# Patient Record
Sex: Female | Born: 2005 | Race: Black or African American | Hispanic: No | Marital: Single | Smoking: Never smoker
Health system: Southern US, Community
[De-identification: ages and names within clinical notes are randomized; demographics above are authoritative.]

## PROBLEM LIST (undated history)

## (undated) ENCOUNTER — Emergency Department (HOSPITAL_COMMUNITY): Admission: EM | Payer: Medicaid Other

---
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
## 2024-04-29 ENCOUNTER — Encounter: Payer: Self-pay | Admitting: Physician Assistant

## 2024-04-29 ENCOUNTER — Ambulatory Visit: Payer: Self-pay | Admitting: Physician Assistant

## 2024-04-29 ENCOUNTER — Other Ambulatory Visit (HOSPITAL_COMMUNITY)
Admission: RE | Admit: 2024-04-29 | Discharge: 2024-04-29 | Disposition: A | Discharge status: Home / Self Care | Source: Ambulatory Visit | Attending: Physician Assistant | Admitting: Physician Assistant

## 2024-04-29 VITALS — BP 99/61 | HR 76 | Ht 62.0 in | Wt 148.2 lb

## 2024-04-29 DIAGNOSIS — B3731 Acute candidiasis of vulva and vagina: Secondary | ICD-10-CM | POA: Diagnosis not present

## 2024-04-29 DIAGNOSIS — B379 Candidiasis, unspecified: Secondary | ICD-10-CM

## 2024-04-29 NOTE — Progress Notes (Signed)
 GYNECOLOGY  VISIT   HPI: Diane Mora is a 18 y.o.  Single female here for recurrent yeast infection    Patient reports three yeast infections since July 2024, with most recent infection earlier this month. She is asymptomatic today, presents for test of cure to ensure last infection is resolved, and to learn about prevention.   GYNECOLOGIC HISTORY: No LMP recorded. Contraception: none Menopausal hormone therapy:  premenopausal Last mammogram:  Not yet indicated due to age Last pap smear: No results found for: "DIAGPAP"         OB History   No obstetric history on file.        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  No past surgical history on file.  Current Outpatient Medications  Medication Sig Dispense Refill   doxycycline (VIBRAMYCIN) 100 MG capsule Take 100 mg by mouth 2 (two) times daily.     No current facility-administered medications for this visit.     ALLERGIES: Patient has no known allergies.  No family history on file.  Social History   Socioeconomic History   Marital status: Single    Spouse name: Not on file   Number of children: Not on file   Years of education: Not on file   Highest education level: Not on file  Occupational History   Not on file  Tobacco Use   Smoking status: Never    Passive exposure: Yes   Smokeless tobacco: Never  Substance and Sexual Activity   Alcohol use: Not on file   Drug use: Not on file   Sexual activity: Not on file  Other Topics Concern   Not on file  Social History Narrative   Diane Mora is a rising 11th grade student.   She attends Education officer, environmental at [employer]   She lives with both parents.   She has two siblings.   Social Drivers of Corporate investment banker Strain: Not on file  Food Insecurity: Not on file  Transportation Needs: Not on file  Physical Activity: Not on file   Stress: Not on file  Social Connections: Not on file  Intimate Partner Violence: Not on file    Review of Systems  PHYSICAL EXAMINATION:    There were no vitals taken for this visit.    General appearance: alert, cooperative and appears stated age Head: Normocephalic, without obvious abnormality, atraumatic Neck: no adenopathy, supple, symmetrical, trachea midline  Lungs: clear to auscultation bilaterally Heart: regular rate  Skin: Skin color, texture, turgor normal.  Neurologic: Grossly normal  ASSESSMENT & PLAN   1. Candida vaginitis (Primary) Patient presents reporting 3 candida infections over past year. Asymptomatic today, desires TOC for last infection earlier this month. Unclear etiology given no ABX therapy during this time or pertinent medical history. We discussed sustained maintenance therapy if swab is positive today, to which she agrees. We discussed causes of candida and lifestyle habits to prevent infection, also available in AVS.  - Cervicovaginal ancillary only( [place])   An After Visit Summary was printed and given to the patient.  Klee Kolek E Candice Tobey, PA-C 5/16/20258:00 AM

## 2024-04-29 NOTE — Progress Notes (Signed)
 Pt reports 3 yeast infections over the last year. Symptoms alleviated with Diflucan. Denies symptoms today. Requesting swab today

## 2024-04-29 NOTE — Patient Instructions (Addendum)
 What is a vulvovaginal yeast infection?  This is an infection that causes itching and irritation of the vulva, the outer lips of the vagina (figure 1). The infection is usually caused by a fungus called "Candida." (Yeast are a type of fungus.)  What causes yeast infections?  The fungus that causes yeast infections normally lives in the vagina and the gut. Even though the yeast are there, they do not usually cause symptoms. Certain medicines (especially antibiotics), stress, and other things can cause the fungus to grow more than it should. When that happens, a yeast infection can start.  Your risk of getting a yeast infection is higher if you:  ?Have diabetes  ?Are pregnant  ?Have a weaker-than-normal immune system  Yeast infections are not usually spread through sex.  What are the symptoms of a yeast infection?  Symptoms include:  ?Itching of the vulva (this is the most common symptom)  ?Pain, redness, or irritation of the vulva and vagina  ?Pain when you urinate  ?Pain during sex  ?Abnormal vaginal discharge, which might be thick and white or thin and watery  The symptoms of a yeast infection are a lot like the symptoms of many other conditions. The best way to find out what is causing your symptoms is to see your doctor or nurse. This way, you can get the right treatment.  Is there a test for yeast infection?  Yes. Your doctor or nurse will use a swab to get a sample of fluid from your vagina. Then, they will put it under a microscope and look for the fungus that causes yeast infections. Sometimes, they might do a test to find out which type of yeast you have.  Depending on your situation, your doctor or nurse might do other tests on your vaginal fluid, too. One common test checks for yeast infections as well as bacterial vaginosis and trichomoniasis. These are other infections that can also cause itching and irritation.  How are yeast infections treated?  Yeast  infections are treated with medicines. All medicines for yeast infections work by killing the fungus that causes the infections. They come in different forms:  ?A pill you take by mouth  ?Medicines you put in your vagina and on your vulva - These come as creams or tablets. This is the preferred treatment for pregnant people.  When will I feel better?  You will probably feel better within a few days of starting treatment. If you do not get better after you finish treatment, see your doctor or nurse again. You might need to take more medicine or a different medicine.  What if I get yeast infections often?  Tell your doctor or nurse. They can help find out whether your symptoms are caused by a yeast infection and, if so, which type of yeast. There are a few different types of yeast, and they respond to different treatments. Plus, the same symptoms that you get with a yeast infection can sometimes be caused by other types of infections, an allergy, or other problems. If you get frequent infections, you might need a different treatment than what you tried in the past.  When should I call the doctor?  Call your doctor or nurse for advice if your symptoms do not get better after treatment. It might take up to a week for symptoms to improve.  To prevent yeast infections:   -Keep the genital area cool and dry by wearing cotton underwear, avoiding tight-fitting clothes, and changing out of damp  clothes or wet bathing suits promptly.  -Avoid triggers. Identifying potential triggers such as antibiotic use, hormonal changes, and high blood sugar can help reduce the risk of recurrence.

## 2024-05-02 ENCOUNTER — Ambulatory Visit: Payer: Self-pay | Admitting: Physician Assistant

## 2024-05-02 ENCOUNTER — Other Ambulatory Visit: Payer: Self-pay

## 2024-05-02 DIAGNOSIS — B9689 Other specified bacterial agents as the cause of diseases classified elsewhere: Secondary | ICD-10-CM

## 2024-05-02 LAB — CERVICOVAGINAL ANCILLARY ONLY
Bacterial Vaginitis (gardnerella): POSITIVE — AB
Candida Glabrata: NEGATIVE
Candida Vaginitis: NEGATIVE
Comment: NEGATIVE
Comment: NEGATIVE
Comment: NEGATIVE

## 2024-05-03 MED ORDER — METRONIDAZOLE 500 MG PO TABS: 500.0000 mg | ORAL_TABLET | Freq: Two times a day (BID) | ORAL | 0 refills | Status: AC

## 2024-05-19 ENCOUNTER — Ambulatory Visit: Payer: Self-pay | Admitting: Physician Assistant

## 2024-05-26 ENCOUNTER — Ambulatory Visit: Payer: Self-pay | Admitting: Physician Assistant

## 2024-05-26 ENCOUNTER — Encounter: Payer: Self-pay | Admitting: Physician Assistant

## 2024-05-26 ENCOUNTER — Other Ambulatory Visit (HOSPITAL_COMMUNITY)
Admission: RE | Admit: 2024-05-26 | Discharge: 2024-05-26 | Disposition: A | Discharge status: Home / Self Care | Source: Ambulatory Visit | Attending: Physician Assistant | Admitting: Physician Assistant

## 2024-05-26 VITALS — BP 104/73 | HR 64 | Ht 62.0 in | Wt 146.2 lb

## 2024-05-26 DIAGNOSIS — N760 Acute vaginitis: Secondary | ICD-10-CM | POA: Insufficient documentation

## 2024-05-26 DIAGNOSIS — B9689 Other specified bacterial agents as the cause of diseases classified elsewhere: Secondary | ICD-10-CM | POA: Insufficient documentation

## 2024-05-26 NOTE — Progress Notes (Signed)
 Here for f/u following bv treatment.

## 2024-05-26 NOTE — Progress Notes (Signed)
 GYNECOLOGY  VISIT   HPI: Diane Mora is a 18 y.o.  single female [id number] here for f/u positive BV swab. Would like test of cure. Finished Flagyl  regimen.   GYNECOLOGIC HISTORY: Patient's last menstrual period was 04/21/2024. 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  No past surgical history on file.  Current Outpatient Medications  Medication Sig Dispense Refill   doxycycline (VIBRAMYCIN) 100 MG capsule Take 100 mg by mouth 2 (two) times daily. (Patient not taking: Reported on 04/29/2024)     metroNIDAZOLE  (FLAGYL ) 500 MG tablet Take 1 tablet (500 mg total) by mouth 2 (two) times daily. 14 tablet 0   No current facility-administered medications for this visit.     ALLERGIES: Patient has no known allergies.  No family history on file.  Social History   Socioeconomic History   Marital status: Single    Spouse name: Not on file   Number of children: Not on file   Years of education: Not on file   Highest education level: Not on file  Occupational History   Not on file  Tobacco Use   Smoking status: Never    Passive exposure: Yes   Smokeless tobacco: Never  Vaping Use   Vaping status: Never Used  Substance and Sexual Activity   Alcohol use: Never   Drug use: Never   Sexual activity: Never    Birth control/protection: None  Other Topics Concern   Not on file  Social History Narrative   Diane Mora is a rising 11th grade student.   She attends Education officer, environmental at [employer]   She lives with both  parents.   She has two siblings.   Social Drivers of Corporate investment banker Strain: Not on file  Food Insecurity: Not on file  Transportation Needs: Not on file  Physical Activity: Not on file  Stress: Not on file  Social Connections: Not on file  Intimate Partner Violence: Not on file    Review of Systems  PHYSICAL EXAMINATION:    LMP 04/21/2024     General appearance: alert, cooperative and appears stated age Head: Normocephalic, without obvious abnormality, atraumatic Skin: Skin color, texture, turgor normal. No rashes or lesions Neurologic: Grossly normal  ASSESSMENT & PLAN  1. Bacterial vaginosis (Primary) Patient with positive swab, completed course metronidazole  x7 d, desires test of cure today. We discussed etiology and measures to prevent bacterial vaginosis, to which patient voiced understanding.  - Cervicovaginal ancillary only( [place])   An After Visit Summary was printed and given to the patient.  Diane Mora E Mckensey Berghuis, PA-C 6/12/20257:14 AM

## 2024-05-27 LAB — CERVICOVAGINAL ANCILLARY ONLY
Bacterial Vaginitis (gardnerella): POSITIVE — AB
Comment: NEGATIVE

## 2024-06-07 ENCOUNTER — Ambulatory Visit: Payer: Self-pay | Admitting: Physician Assistant
